# Patient Record
Sex: Male | Born: 1975 | Race: White | Hispanic: No | Marital: Married | Smoking: Never smoker
Health system: Southern US, Community
[De-identification: ages and names within clinical notes are randomized; demographics above are authoritative.]

## PROBLEM LIST (undated history)

## (undated) DIAGNOSIS — I10 Essential (primary) hypertension: Secondary | ICD-10-CM

## (undated) DIAGNOSIS — K219 Gastro-esophageal reflux disease without esophagitis: Secondary | ICD-10-CM

## (undated) HISTORY — PX: SHOULDER SURGERY: SHX246

## (undated) HISTORY — PX: BACK SURGERY: SHX140

## (undated) HISTORY — PX: TONSILLECTOMY: SUR1361

## (undated) HISTORY — PX: KNEE SURGERY: SHX244

## (undated) HISTORY — PX: FEMUR FRACTURE SURGERY: SHX633

## (undated) HISTORY — PX: NASAL SEPTUM SURGERY: SHX37

---

## 1999-05-25 ENCOUNTER — Ambulatory Visit (HOSPITAL_BASED_OUTPATIENT_CLINIC_OR_DEPARTMENT_OTHER): Admission: RE | Admit: 1999-05-25 | Discharge: 1999-05-25 | Payer: Self-pay | Admitting: Otolaryngology

## 2000-11-25 ENCOUNTER — Encounter: Admission: RE | Admit: 2000-11-25 | Discharge: 2000-12-05 | Payer: Self-pay | Admitting: Orthopaedic Surgery

## 2001-06-06 ENCOUNTER — Encounter: Payer: Self-pay | Admitting: Family Medicine

## 2001-06-06 ENCOUNTER — Ambulatory Visit (HOSPITAL_COMMUNITY): Admission: RE | Admit: 2001-06-06 | Discharge: 2001-06-06 | Payer: Self-pay | Admitting: Family Medicine

## 2001-11-18 ENCOUNTER — Ambulatory Visit (HOSPITAL_BASED_OUTPATIENT_CLINIC_OR_DEPARTMENT_OTHER): Admission: RE | Admit: 2001-11-18 | Discharge: 2001-11-18 | Payer: Self-pay | Admitting: Orthopaedic Surgery

## 2001-12-23 ENCOUNTER — Ambulatory Visit (HOSPITAL_COMMUNITY): Admission: RE | Admit: 2001-12-23 | Discharge: 2001-12-23 | Payer: Self-pay | Admitting: Orthopaedic Surgery

## 2001-12-23 ENCOUNTER — Encounter: Payer: Self-pay | Admitting: Orthopaedic Surgery

## 2003-07-16 ENCOUNTER — Ambulatory Visit (HOSPITAL_COMMUNITY): Admission: RE | Admit: 2003-07-16 | Discharge: 2003-07-16 | Payer: Self-pay | Admitting: Surgery

## 2003-07-16 ENCOUNTER — Ambulatory Visit (HOSPITAL_BASED_OUTPATIENT_CLINIC_OR_DEPARTMENT_OTHER): Admission: RE | Admit: 2003-07-16 | Discharge: 2003-07-16 | Payer: Self-pay | Admitting: Surgery

## 2004-12-29 ENCOUNTER — Ambulatory Visit (HOSPITAL_BASED_OUTPATIENT_CLINIC_OR_DEPARTMENT_OTHER): Admission: RE | Admit: 2004-12-29 | Discharge: 2004-12-29 | Payer: Self-pay | Admitting: Otolaryngology

## 2005-01-07 ENCOUNTER — Ambulatory Visit: Payer: Self-pay | Admitting: Internal Medicine

## 2011-02-22 DIAGNOSIS — S7291XA Unspecified fracture of right femur, initial encounter for closed fracture: Secondary | ICD-10-CM | POA: Insufficient documentation

## 2016-09-26 DIAGNOSIS — Z8673 Personal history of transient ischemic attack (TIA), and cerebral infarction without residual deficits: Secondary | ICD-10-CM | POA: Insufficient documentation

## 2016-09-26 DIAGNOSIS — K219 Gastro-esophageal reflux disease without esophagitis: Secondary | ICD-10-CM | POA: Insufficient documentation

## 2017-09-23 ENCOUNTER — Other Ambulatory Visit: Payer: Self-pay

## 2017-09-23 ENCOUNTER — Emergency Department (HOSPITAL_BASED_OUTPATIENT_CLINIC_OR_DEPARTMENT_OTHER)
Admission: EM | Admit: 2017-09-23 | Discharge: 2017-09-23 | Disposition: A | Discharge status: Home / Self Care | Payer: No Typology Code available for payment source | Attending: Emergency Medicine | Admitting: Emergency Medicine

## 2017-09-23 ENCOUNTER — Encounter (HOSPITAL_BASED_OUTPATIENT_CLINIC_OR_DEPARTMENT_OTHER): Payer: Self-pay | Admitting: *Deleted

## 2017-09-23 ENCOUNTER — Emergency Department (HOSPITAL_BASED_OUTPATIENT_CLINIC_OR_DEPARTMENT_OTHER): Payer: No Typology Code available for payment source

## 2017-09-23 DIAGNOSIS — Z79899 Other long term (current) drug therapy: Secondary | ICD-10-CM | POA: Diagnosis not present

## 2017-09-23 DIAGNOSIS — I10 Essential (primary) hypertension: Secondary | ICD-10-CM | POA: Diagnosis not present

## 2017-09-23 DIAGNOSIS — Y92410 Unspecified street and highway as the place of occurrence of the external cause: Secondary | ICD-10-CM | POA: Insufficient documentation

## 2017-09-23 DIAGNOSIS — Y939 Activity, unspecified: Secondary | ICD-10-CM | POA: Insufficient documentation

## 2017-09-23 DIAGNOSIS — M79645 Pain in left finger(s): Secondary | ICD-10-CM | POA: Diagnosis not present

## 2017-09-23 DIAGNOSIS — M546 Pain in thoracic spine: Secondary | ICD-10-CM

## 2017-09-23 DIAGNOSIS — Z7982 Long term (current) use of aspirin: Secondary | ICD-10-CM | POA: Insufficient documentation

## 2017-09-23 DIAGNOSIS — Y999 Unspecified external cause status: Secondary | ICD-10-CM | POA: Diagnosis not present

## 2017-09-23 DIAGNOSIS — M25551 Pain in right hip: Secondary | ICD-10-CM | POA: Diagnosis not present

## 2017-09-23 HISTORY — DX: Essential (primary) hypertension: I10

## 2017-09-23 HISTORY — DX: Gastro-esophageal reflux disease without esophagitis: K21.9

## 2017-09-23 MED ORDER — IBUPROFEN 800 MG PO TABS
800.0000 mg | ORAL_TABLET | Freq: Once | ORAL | Status: AC
  Administered 2017-09-23: 800 mg via ORAL
  Filled 2017-09-23: qty 1

## 2017-09-23 MED ORDER — ACETAMINOPHEN 500 MG PO TABS
1000.0000 mg | ORAL_TABLET | Freq: Once | ORAL | Status: AC
  Administered 2017-09-23: 1000 mg via ORAL
  Filled 2017-09-23: qty 2

## 2017-09-23 MED ORDER — OXYCODONE HCL 5 MG PO TABS
5.0000 mg | ORAL_TABLET | Freq: Once | ORAL | Status: AC
  Administered 2017-09-23: 5 mg via ORAL
  Filled 2017-09-23: qty 1

## 2017-09-23 NOTE — Discharge Instructions (Signed)
Take 4 over the counter ibuprofen tablets 3 times a day or 2 over-the-counter naproxen tablets twice a day for pain. Also take tylenol 1000mg(2 extra strength) four times a day.    
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
## 2019-08-13 DIAGNOSIS — J1282 Pneumonia due to coronavirus disease 2019: Secondary | ICD-10-CM

## 2019-08-13 DIAGNOSIS — U071 COVID-19: Secondary | ICD-10-CM

## 2019-08-13 HISTORY — DX: COVID-19: U07.1

## 2019-08-13 HISTORY — DX: Pneumonia due to coronavirus disease 2019: J12.82

## 2019-09-10 DIAGNOSIS — U071 COVID-19: Secondary | ICD-10-CM | POA: Insufficient documentation

## 2019-09-10 DIAGNOSIS — J9600 Acute respiratory failure, unspecified whether with hypoxia or hypercapnia: Secondary | ICD-10-CM | POA: Insufficient documentation

## 2020-07-11 ENCOUNTER — Emergency Department (INDEPENDENT_AMBULATORY_CARE_PROVIDER_SITE_OTHER)
Admission: RE | Admit: 2020-07-11 | Discharge: 2020-07-11 | Disposition: A | Discharge status: Home / Self Care | Payer: BC Managed Care – PPO | Source: Ambulatory Visit | Attending: Family Medicine | Admitting: Family Medicine

## 2020-07-11 ENCOUNTER — Emergency Department (INDEPENDENT_AMBULATORY_CARE_PROVIDER_SITE_OTHER): Payer: BC Managed Care – PPO

## 2020-07-11 ENCOUNTER — Other Ambulatory Visit: Payer: Self-pay

## 2020-07-11 VITALS — BP 143/99 | HR 77 | Temp 98.8°F | Resp 16 | Ht 72.0 in | Wt 250.0 lb

## 2020-07-11 DIAGNOSIS — J069 Acute upper respiratory infection, unspecified: Secondary | ICD-10-CM | POA: Diagnosis not present

## 2020-07-11 DIAGNOSIS — Z8709 Personal history of other diseases of the respiratory system: Secondary | ICD-10-CM | POA: Diagnosis not present

## 2020-07-11 DIAGNOSIS — Z8616 Personal history of COVID-19: Secondary | ICD-10-CM | POA: Diagnosis not present

## 2020-07-11 NOTE — ED Provider Notes (Signed)
[facility]    CSN: [id number] Arrival date & time: 07/11/20  0843      History   Chief Complaint Chief Complaint  Patient presents with  . Shortness of Breath  . 9am appointment     HPI Troy Ferguson is a 45 y.o. male.   Patient complains of sinus congestion, slightly scratchy throat, and tightness in his chest for two days.  He denies cough and fever.  He feels well otherwise. He was hospitalized for 3 weeks with COVID pneumonia last year. Review of chart records:  Followup chest x-ray [facility]) 11/24/19 revealed persistent but improved opacities.  The history is provided by the patient.    Past Medical History:  Diagnosis Date  . GERD (gastroesophageal reflux disease)   . Hypertension   . Pneumonia due to COVID-19 virus 08/2019    Patient Active Problem List   Diagnosis Date Noted  . COVID-19 09/10/2019  . Acute respiratory failure due to COVID-19 (HCC) 09/10/2019  . GERD without esophagitis 09/26/2016  . History of TIA (transient ischemic attack) 09/26/2016  . Closed fracture of right femur (HCC) 02/22/2011    Past Surgical History:  Procedure Laterality Date  . BACK SURGERY    . FEMUR FRACTURE SURGERY    . KNEE SURGERY    . NASAL SEPTUM SURGERY    . SHOULDER SURGERY    . TONSILLECTOMY         Home Medications    Prior to Admission medications   Medication Sig Start Date End Date Taking? Authorizing Provider  albuterol (PROVENTIL) (2.5 MG/3ML) 0.083% nebulizer solution Take 3 mLs (2.5 mg dose) by nebulization every 6 (six) hours. 10/02/19  Yes [provider]  albuterol (VENTOLIN HFA) 108 (90 Base) MCG/ACT inhaler INH 2 puffs q6 scheduled and q2 prn increased SOB, cough, wheeze.   Administer using spacer device 10/02/19  Yes [provider]  aspirin EC 81 MG tablet Take 81 mg by mouth daily.   Yes [provider]  fluticasone (FLONASE) 50 MCG/ACT nasal spray 2 sprays by Each Nare route daily.  06/26/19  Yes [provider]  Multiple Vitamin (MULTI-VITAMIN) tablet Take by mouth.   Yes [provider]  Olmesartan-amLODIPine-HCTZ 20-5-12.5 MG TABS Take 1 tablet by mouth daily. 06/20/20  Yes [provider]  omeprazole (PRILOSEC) 20 MG capsule Take 1 capsule by mouth daily. 02/25/20  Yes [provider]  LISINOPRIL PO Take by mouth.    [provider]  OMEPRAZOLE PO Take by mouth. Patient not taking: Reported on 07/11/2020    [provider]  TRAZODONE HCL PO Take by mouth. Patient not taking: Reported on 07/11/2020    [provider]    Family History Family History  Problem Relation Age of Onset  . Hypertension Mother   . Healthy Father     Social History Social History   Tobacco Use  . Smoking status: Never Smoker  . Smokeless tobacco: Never Used  Substance Use Topics  . Alcohol use: Not Currently  . Drug use: Never     Allergies   Morphine and related and Prednisone   Review of Systems Review of Systems  Minimal sore throat No cough No pleuritic pain but feels tight in chest No wheezing + nasal congestion + post-nasal drainage No sinus pain/pressure No itchy/red eyes No earache No hemoptysis + SOB No fever/chills No nausea No vomiting No abdominal pain No diarrhea No urinary symptoms No skin rash No fatigue  No myalgias No headache Used OTC meds (Claritin) without relief    Physical Exam Triage Vital Signs ED Triage Vitals  Enc Vitals Group     BP 07/11/20 0904 (!) 143/99     Pulse Rate 07/11/20 0904 77     Resp 07/11/20 0904 16     Temp 07/11/20 0904 98.8 F (37.1 C)     Temp Source 07/11/20 0904 Oral     SpO2 07/11/20 0904 98 %     Weight 07/11/20 0906 250 lb (113.4 kg)     Height 07/11/20 0906 6' (1.829 m)     Head Circumference --      Peak Flow --      Pain Score 07/11/20 0905 0     Pain Loc --      Pain Edu? --      Excl. in GC? --    No data found.  Updated Vital  Signs BP (!) 143/99 (BP Location: Right Arm) Comment: HTN- has not taken meds today  Pulse 77   Temp 98.8 F (37.1 C) (Oral)   Resp 16   Ht 6' (1.829 m)   Wt 113.4 kg   SpO2 98%   BMI 33.91 kg/m   Visual Acuity Right Eye Distance:   Left Eye Distance:   Bilateral Distance:    Right Eye Near:   Left Eye Near:    Bilateral Near:     Physical Exam Nursing notes and Vital Signs reviewed. Appearance:  Patient appears stated age, and in no acute distress Eyes:  Pupils are equal, round, and reactive to light and accomodation.  Extraocular movement is intact.  Conjunctivae are not inflamed  Ears:  Canals normal.  Tympanic membranes normal.  Nose:  Mildly congested turbinates.  No sinus tenderness.  Pharynx:  Normal Neck:  Supple.  Mildly enlarged lateral nodes are present, tender to palpation on the left.   Lungs:  Clear to auscultation.  Breath sounds are equal.  Moving air well. Heart:  Regular rate and rhythm without murmurs, rubs, or gallops.  Abdomen:  Nontender without masses or hepatosplenomegaly.  Bowel sounds are present.  No CVA or flank tenderness.  Extremities:  No edema.  Skin:  No rash present.   UC Treatments / Results  Labs (all labs ordered are listed, but only abnormal results are displayed) Labs Reviewed  NOVEL CORONAVIRUS, NAA    EKG   Radiology DG Chest 2 View  Result Date: 07/11/2020 CLINICAL DATA:  Here today for SOB w/ nasal congestionHospitalized last year w/ COVID pneumonia x 3 weeks in July 2020Concerned about pneumoniaCongestion x 3 days Denies fever or chills EXAM: CHEST - 2 VIEW COMPARISON:  11/24/2019. FINDINGS: Cardiac silhouette is normal in size and configuration. Normal mediastinal and hilar contours. Clear lungs.  No pleural effusion or pneumothorax. Stable changes from a prior lower thoracic spine/upper lumbar spine fusion. No acute skeletal abnormality. IMPRESSION: No active cardiopulmonary disease. Electronically Signed   By: Amie Portland M.D.   On: 07/11/2020 10:06    Procedures Procedures (including critical care time)  Medications Ordered in UC Medications - No data to display  Initial Impression / Assessment and Plan / UC Course  I have reviewed the triage vital signs and the nursing notes.  Pertinent labs & imaging results that were available during my care of the patient were reviewed by me and considered in my medical decision making (see chart for details).    Benign exam.  There is no evidence  of bacterial infection today.  Treat symptomatically for now  COVID19 PCR pending. Followup with Family Doctor if not improved in about 10 days.   Final Clinical Impressions(s) / UC Diagnoses   Final diagnoses:  Viral URI     Discharge Instructions     Take plain guaifenesin (1200mg  extended release tabs such as Mucinex) twice daily, with plenty of water, for cough and congestion. Get adequate rest.   May use Afrin nasal spray (or generic oxymetazoline) each morning for about 5 days and then discontinue.  Also recommend using saline nasal spray several times daily and saline nasal irrigation (AYR is a common brand).  Use Flonase nasal spray each morning after using Afrin nasal spray and saline nasal irrigation. Try warm salt water gargles for sore throat.  Stop all antihistamines (Claritin, etc) for now, and other non-prescription cough/cold preparations. May take Delsym Cough Suppressant ("12 Hour Cough Relief") at bedtime for nighttime cough.  May take Tylenol or ibuprofen for fever, headache, etc.   If your COVID-19 test is positive, isolate yourself for five days from the time of your symptom onset.  At the end of five days you may end isolation if your symptoms have cleared or improved, and you have not had a fever for 24 hours. At this time you should wear a mask for five more days when you are around others.   If symptoms become significantly worse during the night or over the weekend, proceed to the  local emergency room.         ED Prescriptions    None        , MD 07/12/20 07/14/20

## 2020-07-11 NOTE — ED Triage Notes (Signed)
Here today for SOB w/ nasal congestion Hospitalized last year w/ COVID pneumonia x 3 weeks in July 2020 Concerned about pneumonia Congestion x 3 days  Denies fever or chills No OTC meds No COVID vaccine - tests antibodies every few months

## 2020-07-11 NOTE — Discharge Instructions (Addendum)
Take plain guaifenesin (1200mg  extended release tabs such as Mucinex) twice daily, with plenty of water, for cough and congestion. Get adequate rest.   May use Afrin nasal spray (or generic oxymetazoline) each morning for about 5 days and then discontinue.  Also recommend using saline nasal spray several times daily and saline nasal irrigation (AYR is a common brand).  Use Flonase nasal spray each morning after using Afrin nasal spray and saline nasal irrigation. Try warm salt water gargles for sore throat.  Stop all antihistamines (Claritin, etc) for now, and other non-prescription cough/cold preparations. May take Delsym Cough Suppressant ("12 Hour Cough Relief") at bedtime for nighttime cough.  May take Tylenol or ibuprofen for fever, headache, etc.   If your COVID-19 test is positive, isolate yourself for five days from the time of your symptom onset.  At the end of five days you may end isolation if your symptoms have cleared or improved, and you have not had a fever for 24 hours. At this time you should wear a mask for five more days when you are around others.   If symptoms become significantly worse during the night or over the weekend, proceed to the local emergency room.

## 2020-07-12 LAB — SARS-COV-2, NAA 2 DAY TAT

## 2020-07-12 LAB — NOVEL CORONAVIRUS, NAA: SARS-CoV-2, NAA: NOT DETECTED

## 2020-08-05 ENCOUNTER — Emergency Department (INDEPENDENT_AMBULATORY_CARE_PROVIDER_SITE_OTHER)
Admission: RE | Admit: 2020-08-05 | Discharge: 2020-08-05 | Disposition: A | Discharge status: Home / Self Care | Payer: BC Managed Care – PPO | Source: Ambulatory Visit

## 2020-08-05 ENCOUNTER — Ambulatory Visit: Payer: BC Managed Care – PPO

## 2020-08-05 ENCOUNTER — Other Ambulatory Visit: Payer: Self-pay

## 2020-08-05 VITALS — BP 138/95 | HR 91 | Temp 98.5°F | Resp 17

## 2020-08-05 DIAGNOSIS — B9689 Other specified bacterial agents as the cause of diseases classified elsewhere: Secondary | ICD-10-CM

## 2020-08-05 DIAGNOSIS — J019 Acute sinusitis, unspecified: Secondary | ICD-10-CM | POA: Diagnosis not present

## 2020-08-05 DIAGNOSIS — J309 Allergic rhinitis, unspecified: Secondary | ICD-10-CM | POA: Diagnosis not present

## 2020-08-05 MED ORDER — AMOXICILLIN-POT CLAVULANATE 875-125 MG PO TABS: 1.0000 | ORAL_TABLET | Freq: Two times a day (BID) | ORAL | 0 refills | Status: DC

## 2020-08-05 MED ORDER — FEXOFENADINE HCL 180 MG PO TABS: 180.0000 mg | ORAL_TABLET | Freq: Every day | ORAL | 0 refills | Status: AC

## 2020-08-05 NOTE — Discharge Instructions (Addendum)
Advised/instructed patient to take medication as directed with food to completion.  Advised patient to hold Claritin for the next 7 to 10 days and use Allegra 180 mg daily for the next 5 days, then as needed.  Encourage patient increase daily water intake while taking these medications.

## 2020-08-05 NOTE — ED Triage Notes (Signed)
Pt c/o cough (mostly dry) and nasal congestion since Tuesday. Fever of 100 yesterday. Resolved on its own. Mucinex prn. Hx of seasonal allergies, Claritin daily.

## 2020-08-05 NOTE — ED Provider Notes (Signed)
[facility]    CSN: [id number] Arrival date & time: 08/05/20  1051      History   Chief Complaint Chief Complaint  Patient presents with   Cough    APPT 11am   Nasal Congestion    HPI Troy Ferguson is a 45 y.o. male.   HPI 45 year old male presents with cough and nasal congestion for 4 days.  Reports oral temp of 100.0 yesterday is resolved on it's own, orts using Mucinex as needed and has history of seasonal allergies taking Claritin daily.  Patient was evaluated here on 07/11/2020 and found to have viral URI.  Past Medical History:  Diagnosis Date   GERD (gastroesophageal reflux disease)    Hypertension    Pneumonia due to COVID-19 virus 08/2019    Patient Active Problem List   Diagnosis Date Noted   COVID-19 09/10/2019   Acute respiratory failure due to COVID-19 [facility]) 09/10/2019   GERD without esophagitis 09/26/2016   History of TIA (transient ischemic attack) 09/26/2016   Closed fracture of right femur (HCC) 02/22/2011    Past Surgical History:  Procedure Laterality Date   BACK SURGERY     FEMUR FRACTURE SURGERY     KNEE SURGERY     NASAL SEPTUM SURGERY     SHOULDER SURGERY     TONSILLECTOMY         Home Medications    Prior to Admission medications   Medication Sig Start Date End Date Taking? Authorizing Provider  amoxicillin-clavulanate (AUGMENTIN) 875-125 MG tablet Take 1 tablet by mouth every 12 (twelve) hours. 08/05/20  Yes Trevor Iha, FNP  fexofenadine [facility] ALLERGY) 180 MG tablet Take 1 tablet (180 mg total) by mouth daily for 15 days. 08/05/20 08/20/20 Yes Trevor Iha, FNP  albuterol (PROVENTIL) (2.5 MG/3ML) 0.083% nebulizer solution Take 3 mLs (2.5 mg dose) by nebulization every 6 (six) hours. 10/02/19   [provider]  albuterol (VENTOLIN HFA) 108 (90 Base) MCG/ACT inhaler INH 2 puffs q6 scheduled and q2 prn increased SOB, cough, wheeze.   Administer using spacer device 10/02/19   [provider]  aspirin EC  81 MG tablet Take 81 mg by mouth daily.    [provider]  fluticasone (FLONASE) 50 MCG/ACT nasal spray 2 sprays by Each Nare route daily. 06/26/19   [provider]  LISINOPRIL PO Take by mouth.    [provider]  Multiple Vitamin (MULTI-VITAMIN) tablet Take by mouth.    [provider]  Olmesartan-amLODIPine-HCTZ 20-5-12.5 MG TABS Take 1 tablet by mouth daily. 06/20/20   [provider]  omeprazole (PRILOSEC) 20 MG capsule Take 1 capsule by mouth daily. 02/25/20   [provider]  OMEPRAZOLE PO Take by mouth. Patient not taking: Reported on 07/11/2020    [provider]  TRAZODONE HCL PO Take by mouth. Patient not taking: Reported on 07/11/2020    [provider]    Family History Family History  Problem Relation Age of Onset   Hypertension Mother    Healthy Father     Social History Social History   Tobacco Use   Smoking status: Never   Smokeless tobacco: Never  Substance Use Topics   Alcohol use: Not Currently   Drug use: Never     Allergies   Morphine and related and Prednisone   Review of Systems Review of Systems  Constitutional: Negative.   HENT:  Positive for congestion.   Eyes: Negative.   Respiratory:  Positive for cough.  Cardiovascular: Negative.   Gastrointestinal: Negative.   Genitourinary: Negative.   Musculoskeletal: Negative.   Skin: Negative.   Neurological: Negative.     Physical Exam Triage Vital Signs ED Triage Vitals  Enc Vitals Group     BP 08/05/20 1101 (!) 138/95     Pulse Rate 08/05/20 1101 91     Resp 08/05/20 1101 17     Temp 08/05/20 1101 98.5 F (36.9 C)     Temp Source 08/05/20 1101 Oral     SpO2 08/05/20 1101 97 %     Weight --      Height --      Head Circumference --      Peak Flow --      Pain Score 08/05/20 1102 0     Pain Loc --      Pain Edu? --      Excl. in [place]? --    No data found.  Updated Vital Signs BP (!) 138/95 (BP Location: Right  Arm)   Pulse 91   Temp 98.5 F (36.9 C) (Oral)   Resp 17   SpO2 97%     Physical Exam Vitals and nursing note reviewed.  Constitutional:      General: He is not in acute distress.    Appearance: Normal appearance. He is obese. He is not ill-appearing.  HENT:     Head: Normocephalic and atraumatic.     Right Ear: Tympanic membrane and ear canal normal.     Left Ear: Tympanic membrane and ear canal normal.     Nose: Congestion present. No rhinorrhea.     Comments: Greenish/mucopurulent rhinorrhea noted    Mouth/Throat:     Mouth: Mucous membranes are moist.     Pharynx: Oropharynx is clear. No oropharyngeal exudate or posterior oropharyngeal erythema.     Comments: Moderate amount of clear drainage of posterior oropharynx noted Eyes:     Extraocular Movements: Extraocular movements intact.     Conjunctiva/sclera: Conjunctivae normal.     Pupils: Pupils are equal, round, and reactive to light.  Cardiovascular:     Rate and Rhythm: Normal rate and regular rhythm.     Pulses: Normal pulses.     Heart sounds: Normal heart sounds.  Pulmonary:     Effort: Pulmonary effort is normal. No respiratory distress.     Breath sounds: Normal breath sounds. No wheezing, rhonchi or rales.  Musculoskeletal:        General: Normal range of motion.     Cervical back: Normal range of motion and neck supple. No tenderness.  Lymphadenopathy:     Cervical: No cervical adenopathy.  Skin:    General: Skin is warm and dry.  Neurological:     General: No focal deficit present.     Mental Status: He is alert and oriented to person, place, and time.  Psychiatric:        Mood and Affect: Mood normal.        Behavior: Behavior normal.     UC Treatments / Results  Labs (all labs ordered are listed, but only abnormal results are displayed) Labs Reviewed - No data to display  EKG   Radiology No results found.  Procedures Procedures (including critical care time)  Medications Ordered in  UC Medications - No data to display  Initial Impression / Assessment and Plan / UC Course  I have reviewed the triage vital signs and the nursing notes.  Pertinent labs & imaging results that were available  during my care of the patient were reviewed by me and considered in my medical decision making (see chart for details).    MDM: 1.  Acute bacterial rhinosinusitis, 2.  Allergic rhinitis.  Patient discharged home, hemodynamically stable. Final Clinical Impressions(s) / UC Diagnoses   Final diagnoses:  Acute bacterial rhinosinusitis  Allergic rhinitis, unspecified seasonality, unspecified trigger     Discharge Instructions      Advised/instructed patient to take medication as directed with food to completion.  Advised patient to hold Claritin for the next 7 to 10 days and use Allegra 180 mg daily for the next 5 days, then as needed.  Encourage patient increase daily water intake while taking these medications.     ED Prescriptions     Medication Sig Dispense Auth. Provider   amoxicillin-clavulanate (AUGMENTIN) 875-125 MG tablet Take 1 tablet by mouth every 12 (twelve) hours. 14 tablet Trevor Iha, FNP   fexofenadine [facility] ALLERGY) 180 MG tablet Take 1 tablet (180 mg total) by mouth daily for 15 days. 15 tablet Trevor Iha, FNP      PDMP not reviewed this encounter.   Trevor Iha, FNP 08/05/20 1151

## 2021-03-28 ENCOUNTER — Emergency Department (INDEPENDENT_AMBULATORY_CARE_PROVIDER_SITE_OTHER)
Admission: EM | Admit: 2021-03-28 | Discharge: 2021-03-28 | Disposition: A | Discharge status: Home / Self Care | Payer: BC Managed Care – PPO | Source: Home / Self Care | Attending: Family Medicine | Admitting: Family Medicine

## 2021-03-28 ENCOUNTER — Other Ambulatory Visit: Payer: Self-pay

## 2021-03-28 ENCOUNTER — Emergency Department: Admit: 2021-03-28 | Discharge status: Absent without leave | Payer: Self-pay

## 2021-03-28 DIAGNOSIS — J069 Acute upper respiratory infection, unspecified: Secondary | ICD-10-CM | POA: Diagnosis not present

## 2021-03-28 LAB — POC SARS CORONAVIRUS 2 AG -  ED: SARS Coronavirus 2 Ag: NEGATIVE

## 2021-03-28 MED ORDER — PREDNISONE 20 MG PO TABS: 20.0000 mg | ORAL_TABLET | Freq: Two times a day (BID) | ORAL | 0 refills | Status: AC

## 2021-03-28 NOTE — Discharge Instructions (Signed)
The COVID test is negative Sometimes a COVID test early on is flawed, if you continue sick I would repeat the COVID test in 48 hours For now I am going to give you prednisone to help with the shortness of breath and symptoms Rest and drink lots of fluids Follow-up with your usual doctor

## 2021-03-28 NOTE — ED Provider Notes (Signed)
[facility]    CSN: [id number] Arrival date & time: 03/28/21  1917      History   Chief Complaint Chief Complaint  Patient presents with   Cough   Sore Throat   Shortness of Breath    HPI ZAELYN BARBARY is a 46 y.o. male.   HPI  Patient was desperately ill with COVID in 2021, was hospitalized for 3 weeks and was on oxygen for an additional 3 months.  He was left with shortness of breath with exertion and his lungs never returned to normal.  He is back at work.  He currently has cough and shortness of breath since yesterday.  Body aches and some headache.  No sweats chills or fever.  Past Medical History:  Diagnosis Date   GERD (gastroesophageal reflux disease)    Hypertension    Pneumonia due to COVID-19 virus 08/2019    Patient Active Problem List   Diagnosis Date Noted   COVID-19 09/10/2019   Acute respiratory failure due to COVID-19 [facility]) 09/10/2019   GERD without esophagitis 09/26/2016   History of TIA (transient ischemic attack) 09/26/2016   Closed fracture of right femur (HCC) 02/22/2011    Past Surgical History:  Procedure Laterality Date   BACK SURGERY     FEMUR FRACTURE SURGERY     KNEE SURGERY     NASAL SEPTUM SURGERY     SHOULDER SURGERY     TONSILLECTOMY         Home Medications    Prior to Admission medications   Medication Sig Start Date End Date Taking? Authorizing Provider  albuterol (PROVENTIL) (2.5 MG/3ML) 0.083% nebulizer solution Take 3 mLs (2.5 mg dose) by nebulization every 6 (six) hours. 10/02/19   [provider]  albuterol (VENTOLIN HFA) 108 (90 Base) MCG/ACT inhaler INH 2 puffs q6 scheduled and q2 prn increased SOB, cough, wheeze.   Administer using spacer device 10/02/19   [provider]  aspirin EC 81 MG tablet Take 81 mg by mouth daily.    [provider]  fexofenadine (ALLEGRA ALLERGY) 180 MG tablet Take 1 tablet (180 mg total) by mouth daily for 15 days. 08/05/20 08/20/20  Trevor Iha, FNP   fluticasone (FLONASE) 50 MCG/ACT nasal spray 2 sprays by Each Nare route daily. 06/26/19   [provider]  LISINOPRIL PO Take by mouth.    [provider]  Multiple Vitamin (MULTI-VITAMIN) tablet Take by mouth.    [provider]  Olmesartan-amLODIPine-HCTZ 20-5-12.5 MG TABS Take 1 tablet by mouth daily. 06/20/20   [provider]  omeprazole (PRILOSEC) 20 MG capsule Take 1 capsule by mouth daily. 02/25/20   [provider]    Family History Family History  Problem Relation Age of Onset   Hypertension Mother    Healthy Father     Social History Social History   Tobacco Use   Smoking status: Never   Smokeless tobacco: Never  Substance Use Topics   Alcohol use: Not Currently   Drug use: Never     Allergies   Morphine and related and Prednisone   Review of Systems Review of Systems See HPI  Physical Exam Triage Vital Signs ED Triage Vitals  Enc Vitals Group     BP 03/28/21 1923 132/90     Pulse Rate 03/28/21 1923 82     Resp 03/28/21 1923 14     Temp 03/28/21 1923 97.9 F (36.6 C)     Temp Source 03/28/21 1923 Oral  SpO2 03/28/21 1923 96 %     Weight 03/28/21 1925 250 lb (113.4 kg)     Height --      Head Circumference --      Peak Flow --      Pain Score 03/28/21 1925 0     Pain Loc --      Pain Edu? --      Excl. in GC? --    No data found.  Updated Vital Signs BP 132/90 (BP Location: Right Arm)    Pulse 82    Temp 97.9 F (36.6 C) (Oral)    Resp 14    Wt 113.4 kg    SpO2 96%    BMI 33.91 kg/m      Physical Exam Constitutional:      General: He is not in acute distress.    Appearance: He is well-developed.     Comments: Pleasant.  Overweight  HENT:     Head: Normocephalic and atraumatic.     Nose:     Comments: Mask is in place Eyes:     Conjunctiva/sclera: Conjunctivae normal.     Pupils: Pupils are equal, round, and reactive to light.  Cardiovascular:     Rate and Rhythm: Normal rate and regular  rhythm.     Heart sounds: Normal heart sounds.  Pulmonary:     Effort: Pulmonary effort is normal. No respiratory distress.     Breath sounds: Normal breath sounds.  Abdominal:     General: There is no distension.     Palpations: Abdomen is soft.  Musculoskeletal:        General: Normal range of motion.     Cervical back: Normal range of motion.  Skin:    General: Skin is warm and dry.  Neurological:     Mental Status: He is alert.  Psychiatric:        Mood and Affect: Mood normal.        Behavior: Behavior normal.     UC Treatments / Results  Labs (all labs ordered are listed, but only abnormal results are displayed) Labs Reviewed  POC SARS CORONAVIRUS 2 AG -  ED    EKG   Radiology No results found.  Procedures Procedures (including critical care time)  Medications Ordered in UC Medications - No data to display  Initial Impression / Assessment and Plan / UC Course  I have reviewed the triage vital signs and the nursing notes.  Pertinent labs & imaging results that were available during my care of the patient were reviewed by me and considered in my medical decision making (see chart for details).     Lungs are clear I discussed my opinion that he should have COVID vaccinations and booster  He currently has a viral upper respiratory infection and can take over-the-counter medications we will give him prednisone for his sensation of shortness of breath Final Clinical Impressions(s) / UC Diagnoses   Final diagnoses:  Viral upper respiratory tract infection     Discharge Instructions      The COVID test is negative Sometimes a COVID test early on is flawed, if you continue sick I would repeat the COVID test in 48 hours For now I am going to give you prednisone to help with the shortness of breath and symptoms Rest and drink lots of fluids Follow-up with your usual doctor   ED Prescriptions   None    PDMP not reviewed this encounter.   Eustace Moore,  MD 03/28/21 1954

## 2021-03-28 NOTE — ED Triage Notes (Signed)
Pt presents with cough, sore throat, and SOB that began yesterday.  Pt had hospitalization for three weeks with Covid in 2021. Pt unvaccinated

## 2021-05-02 ENCOUNTER — Emergency Department (INDEPENDENT_AMBULATORY_CARE_PROVIDER_SITE_OTHER)
Admission: RE | Admit: 2021-05-02 | Discharge: 2021-05-02 | Disposition: A | Discharge status: Home / Self Care | Payer: BC Managed Care – PPO | Source: Ambulatory Visit

## 2021-05-02 ENCOUNTER — Emergency Department (INDEPENDENT_AMBULATORY_CARE_PROVIDER_SITE_OTHER): Payer: BC Managed Care – PPO

## 2021-05-02 ENCOUNTER — Other Ambulatory Visit: Payer: Self-pay

## 2021-05-02 VITALS — BP 121/80 | HR 71 | Temp 98.6°F | Resp 18 | Ht 72.0 in | Wt 250.0 lb

## 2021-05-02 DIAGNOSIS — S92514B Nondisplaced fracture of proximal phalanx of right lesser toe(s), initial encounter for open fracture: Secondary | ICD-10-CM

## 2021-05-02 DIAGNOSIS — S99821A Other specified injuries of right foot, initial encounter: Secondary | ICD-10-CM | POA: Diagnosis not present

## 2021-05-02 NOTE — Discharge Instructions (Addendum)
Advised/informed patient of right foot x-ray results this evening-nondisplaced acute fracture of the proximal phalanx in the right fifth toe with surrounding soft tissue swelling.  Hard copy of this study provided to patient with AVS.  Advised patient to wear right postop shoe 24/7 (except when sleeping and bathing) until evaluation by orthopedic provider.  [facility]opedic provider contact information provided with this AVS.  Advised patient to call this provider first thing tomorrow morning, Wednesday, 05/03/2021 to schedule appointment for further evaluation.  Advised patient may take OTC Ibuprofen 800 mg 1-2 times daily, as needed. Advised patient if symptoms worsen and/or unresolved please follow-up with PCP, orthopedics or here for further evaluation ?

## 2021-05-02 NOTE — ED Triage Notes (Signed)
Pt presents to Urgent Care with c/o R 5th toe pain after injury last night. State he accidentally kicked a suitcase and has had swelling and pain to the toe since.  ?

## 2021-05-02 NOTE — ED Provider Notes (Signed)
?[place] ? ? ? ?CSN: [id number] ?Arrival date & time: 05/02/21  1644 ? ? ?  ? ?History   ?Chief Complaint ?Chief Complaint  ?Patient presents with  ? Toe Injury  ?  Possibly may been broken. - Entered by patient  ? ? ?HPI ?Troy Ferguson is a 46 y.o. male.  ? ?HPI 46 year old male presents with right fifth toe pain after injury last night.  Reports he accidentally kicked a suitcase and has had swelling and pain to the toe since.  PMH significant for HTN and GERD. ? ?Past Medical History:  ?Diagnosis Date  ? GERD (gastroesophageal reflux disease)   ? Hypertension   ? Pneumonia due to COVID-19 virus 08/2019  ? ? ?Patient Active Problem List  ? Diagnosis Date Noted  ? COVID-19 09/10/2019  ? Acute respiratory failure due to COVID-19 [facility]) 09/10/2019  ? GERD without esophagitis 09/26/2016  ? History of TIA (transient ischemic attack) 09/26/2016  ? Closed fracture of right femur ([place]) 02/22/2011  ? ? ?Past Surgical History:  ?Procedure Laterality Date  ? BACK SURGERY    ? FEMUR FRACTURE SURGERY    ? KNEE SURGERY    ? NASAL SEPTUM SURGERY    ? SHOULDER SURGERY    ? TONSILLECTOMY    ? ? ? ? ? ?Home Medications   ? ?Prior to Admission medications   ?Medication Sig Start Date End Date Taking? Authorizing Provider  ?ibuprofen (ADVIL) 400 MG tablet Take 400 mg by mouth every 6 (six) hours as needed.   Yes [provider]  ?albuterol (PROVENTIL) (2.5 MG/3ML) 0.083% nebulizer solution Take 3 mLs (2.5 mg dose) by nebulization every 6 (six) hours. 10/02/19   [provider]  ?albuterol (VENTOLIN HFA) 108 (90 Base) MCG/ACT inhaler INH 2 puffs q6 scheduled and q2 prn increased SOB, cough, wheeze.   Administer using spacer device 10/02/19   [provider]  ?aspirin EC 81 MG tablet Take 81 mg by mouth daily.    [provider]  ?fexofenadine (ALLEGRA ALLERGY) 180 MG tablet Take 1 tablet (180 mg total) by mouth daily for 15 days. 08/05/20 08/20/20  Eliezer Lofts, FNP  ?fluticasone (FLONASE)  50 MCG/ACT nasal spray 2 sprays by Each Nare route daily. 06/26/19   [provider]  ?LISINOPRIL PO Take by mouth.    [provider]  ?Multiple Vitamin (MULTI-VITAMIN) tablet Take by mouth.    [provider]  ?Olmesartan-amLODIPine-HCTZ 20-5-12.5 MG TABS Take 1 tablet by mouth daily. 06/20/20   [provider]  ?omeprazole (PRILOSEC) 20 MG capsule Take 1 capsule by mouth daily. 02/25/20   [provider]  ?predniSONE (DELTASONE) 20 MG tablet Take 1 tablet (20 mg total) by mouth 2 (two) times daily with a meal. 03/28/21   Raylene Everts, MD  ? ? ?Family History ?Family History  ?Problem Relation Age of Onset  ? Hypertension Mother   ? Healthy Father   ? ? ?Social History ?Social History  ? ?Tobacco Use  ? Smoking status: Never  ? Smokeless tobacco: Never  ?Vaping Use  ? Vaping Use: Never used  ?Substance Use Topics  ? Alcohol use: Not Currently  ? Drug use: Never  ? ? ? ?Allergies   ?Morphine and related and Prednisone ? ? ?Review of Systems ?Review of Systems  ?Musculoskeletal:   ?     Right fifth toe pain x1 day  ? ? ?Physical Exam ?Triage Vital Signs ?ED Triage Vitals  ?Enc Vitals Group  ?  BP 05/02/21 1747 121/80  ?   Pulse Rate 05/02/21 1747 71  ?   Resp 05/02/21 1747 18  ?   Temp 05/02/21 1747 98.6 ?F (37 ?C)  ?   Temp src --   ?   SpO2 05/02/21 1747 94 %  ?   Weight 05/02/21 1743 250 lb (113.4 kg)  ?   Height 05/02/21 1743 6' (1.829 m)  ?   Head Circumference --   ?   Peak Flow --   ?   Pain Score 05/02/21 1743 8  ?   Pain Loc --   ?   Pain Edu? --   ?   Excl. in [place]? --   ? ?No data found. ? ?Updated Vital Signs ?BP 121/80 (BP Location: Right Arm)   Pulse 71   Temp 98.6 ?F (37 ?C)   Resp 18   Ht 6' (1.829 m)   Wt 250 lb (113.4 kg)   SpO2 94%   BMI 33.91 kg/m?  ? ?Physical Exam ?Vitals reviewed.  ?Constitutional:   ?   General: He is not in acute distress. ?   Appearance: Normal appearance. He is obese. He is not ill-appearing.  ?HENT:  ?   Mouth/Throat:  ?    Mouth: Mucous membranes are moist.  ?   Pharynx: Oropharynx is clear.  ?Eyes:  ?   Extraocular Movements: Extraocular movements intact.  ?   Conjunctiva/sclera: Conjunctivae normal.  ?   Pupils: Pupils are equal, round, and reactive to light.  ?Cardiovascular:  ?   Rate and Rhythm: Normal rate and regular rhythm.  ?   Pulses: Normal pulses.  ?   Heart sounds: Normal heart sounds.  ?Pulmonary:  ?   Effort: Pulmonary effort is normal.  ?   Breath sounds: Normal breath sounds. No wheezing, rhonchi or rales.  ?Musculoskeletal:  ?   Cervical back: Normal range of motion and neck supple.  ?   Comments: Right foot (dorsum over proximal aspect of 4th & 5th toes): TTP with mild to moderate soft tissue swelling noted  ?Skin: ?   General: Skin is warm and dry.  ?Neurological:  ?   General: No focal deficit present.  ?   Mental Status: He is alert and oriented to person, place, and time. Mental status is at baseline.  ? ? ? ?UC Treatments / Results  ?Labs ?(all labs ordered are listed, but only abnormal results are displayed) ?Labs Reviewed - No data to display ? ?EKG ? ? ?Radiology ?DG Foot Complete Right ? ?Result Date: 05/02/2021 ?CLINICAL DATA:  Injury to right fifth toe last night EXAM: RIGHT FOOT COMPLETE - 3+ VIEW COMPARISON:  None. FINDINGS: Nondisplaced acute fracture of the proximal aspect of the proximal phalanx in the right fifth toe with surrounding soft tissue swelling, without definite extension to the proximal articular surface. No additional fracture. No dislocation. No suspicious focal osseous lesions. Small Achilles right calcaneal spur. No significant degenerative arthropathy. No radiopaque foreign bodies. IMPRESSION: Nondisplaced acute fracture of the proximal phalanx in the right fifth toe with surrounding soft tissue swelling. Electronically Signed   By: Ilona Sorrel M.D.   On: 05/02/2021 17:57   ? ?Procedures ?Procedures (including critical care time) ? ?Medications Ordered in UC ?Medications - No data  to display ? ?Initial Impression / Assessment and Plan / UC Course  ?I have reviewed the triage vital signs and the nursing notes. ? ?Pertinent labs & imaging results that were available during my care  of the patient were reviewed by me and considered in my medical decision making (see chart for details). ? ?  ? ?MDM: 1.  Nondisplaced fracture of proximal phalanx of right lesser toes, initial encounter-Advised/informed patient of right foot x-ray results this evening-nondisplaced acute fracture of the proximal phalanx in the right fifth toe with surrounding soft tissue swelling.  Hard copy of this study provided to patient with AVS.  Advised patient to wear right postop shoe 24/7 (except when sleeping and bathing) until evaluation by orthopedic provider.  [facility] provider contact information provided with this AVS.  Advised patient to call this provider first thing tomorrow morning, Wednesday, 05/03/2021 to schedule appointment for further evaluation.  Advised patient may take OTC Ibuprofen 800 mg 1-2 times daily, as needed. Advised patient if symptoms worsen and/or unresolved please follow-up with PCP, orthopedics or here for further evaluation.  Right postop shoe placed on patient prior to discharge patient discharged home, hemodynamically stable. ?Final Clinical Impressions(s) / UC Diagnoses  ? ?Final diagnoses:  ?Nondisplaced fracture of proximal phalanx of right lesser toe(s), initial encounter for open fracture  ? ? ? ?Discharge Instructions   ? ?  ?Advised/informed patient of right foot x-ray results this evening-nondisplaced acute fracture of the proximal phalanx in the right fifth toe with surrounding soft tissue swelling.  Hard copy of this study provided to patient with AVS.  Advised patient to wear right postop shoe 24/7 (except when sleeping and bathing) until evaluation by orthopedic provider.  [facility] orthopedic provider contact information provided with this AVS.  Advised patient to  call this provider first thing tomorrow morning, Wednesday, 05/03/2021 to schedule appointment for further evaluation.  Advised patient may take OTC Ibuprofen 800 mg 1-2 times daily, as needed. Advised pati

## 2022-07-12 IMAGING — DX DG CHEST 2V
2 series · 2 of 2 positions shown · non-contrast
Comparison: 11/24/2019.

CLINICAL DATA: Here today for SOB w/ nasal congestionHospitalized
last year w/ COVID pneumonia x 3 weeks in August 2018Concerned about
pneumoniaCongestion x 3 days Denies fever or chills

EXAM:
CHEST - 2 VIEW

[chest pa]
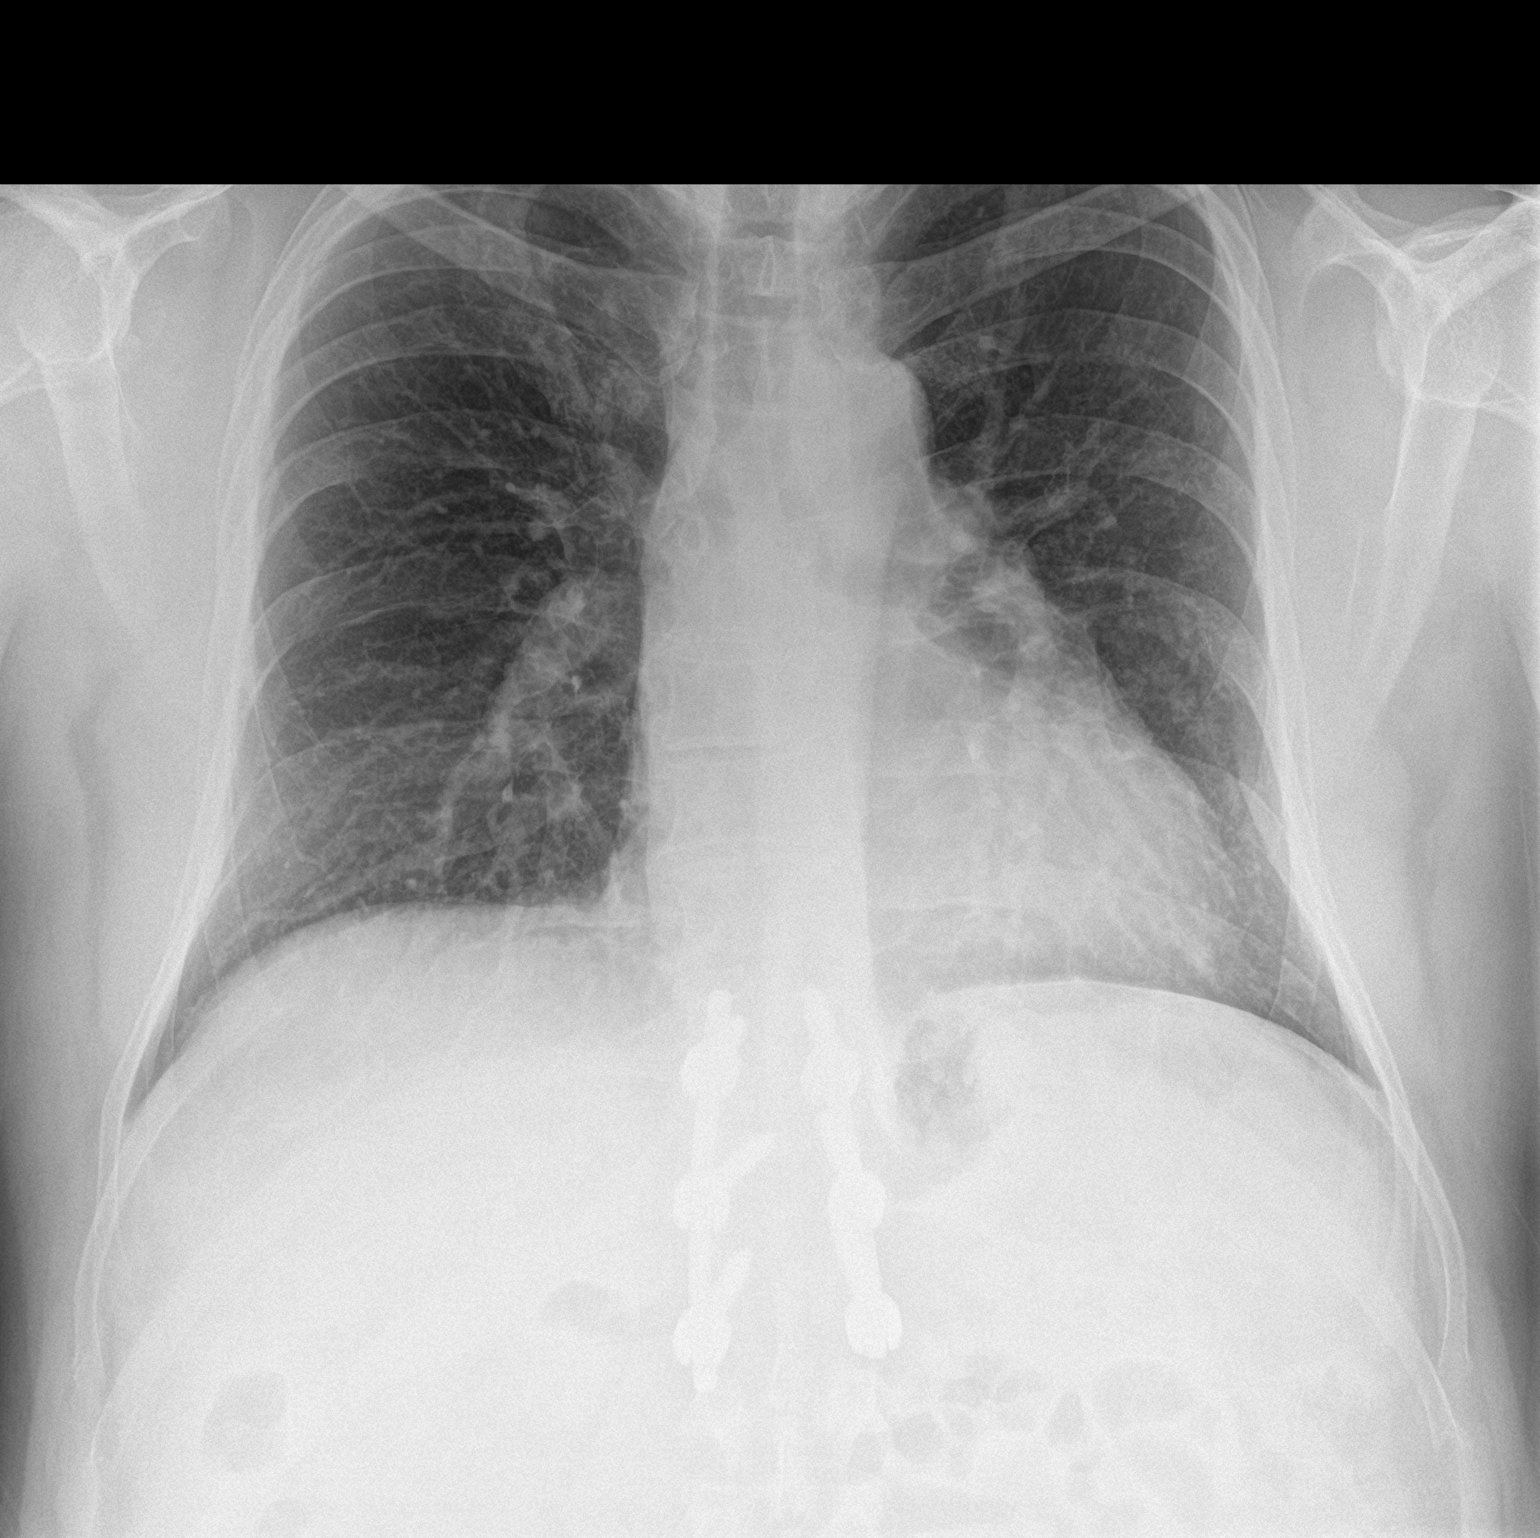

[chest lat]
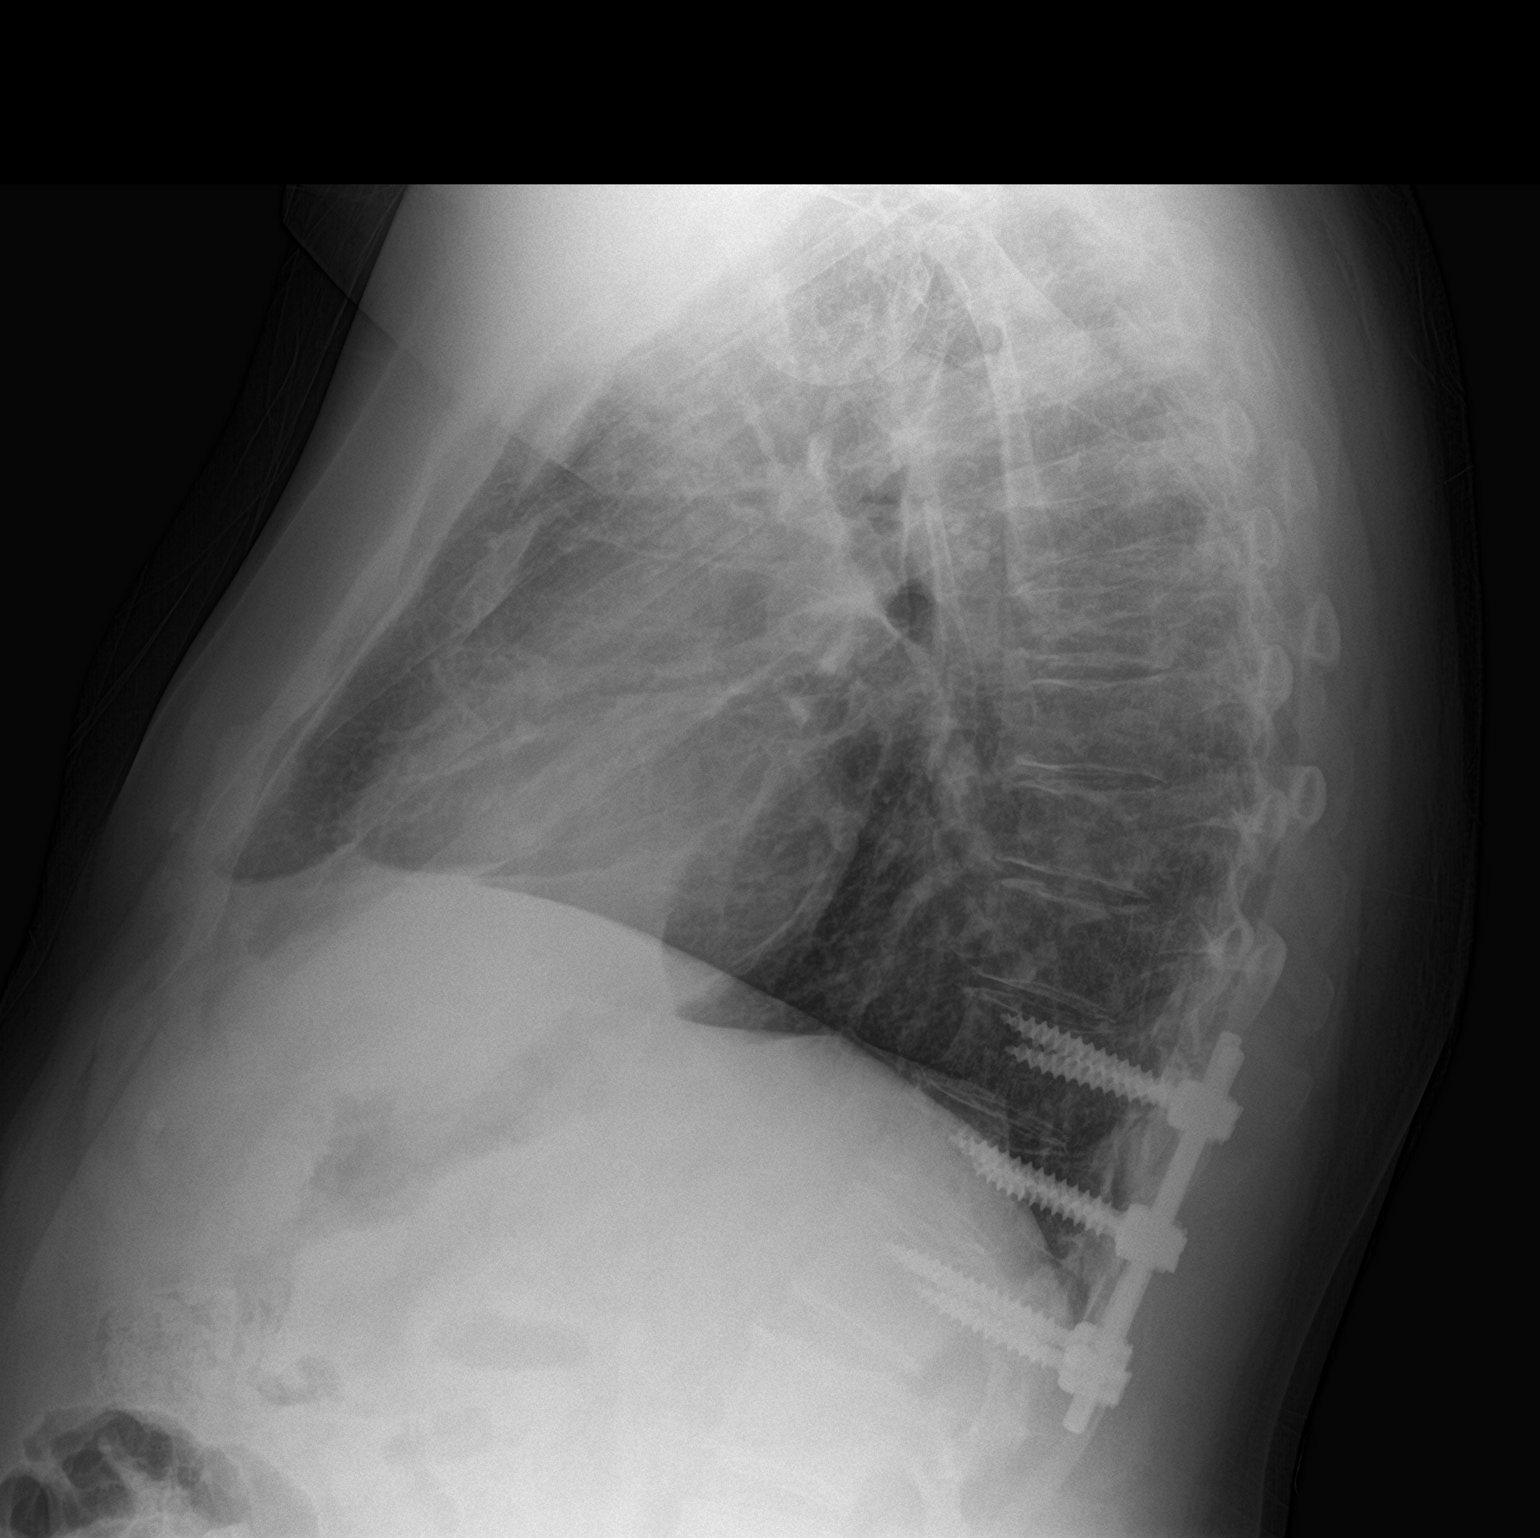

[2 of 2 positions shown; findings below may reference images not displayed]

FINDINGS: Cardiac silhouette is normal in size and configuration. Normal
mediastinal and hilar contours.

Clear lungs.  No pleural effusion or pneumothorax.

Stable changes from a prior lower thoracic spine/upper lumbar spine
fusion. No acute skeletal abnormality.
IMPRESSION: No active cardiopulmonary disease.

## 2023-05-03 IMAGING — DX DG FOOT COMPLETE 3+V*R*
3 series · 3 of 3 positions shown · non-contrast
Comparison: None.

CLINICAL DATA: Injury to right fifth toe last night

EXAM:
RIGHT FOOT COMPLETE - 3+ VIEW

[foot ap]
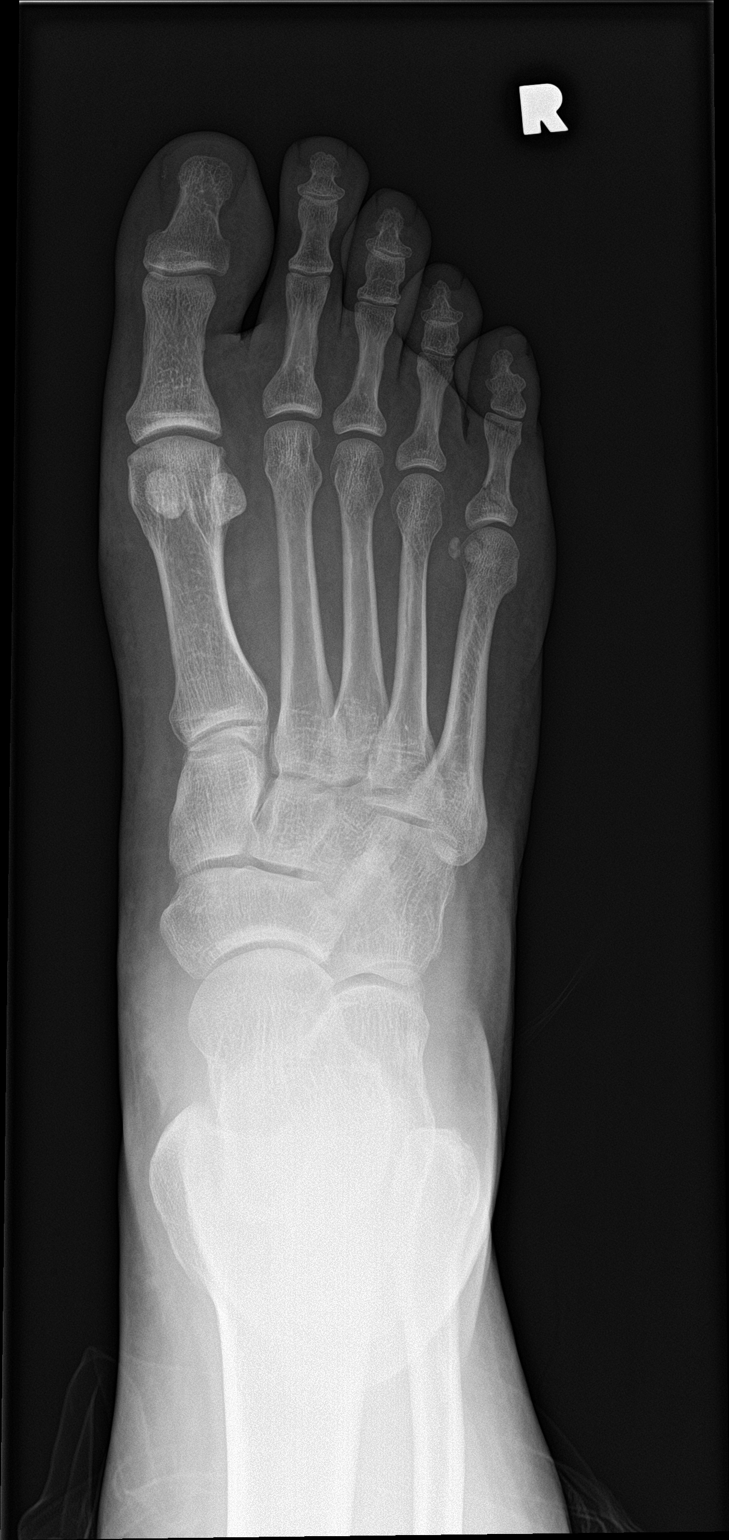

[foot obl]
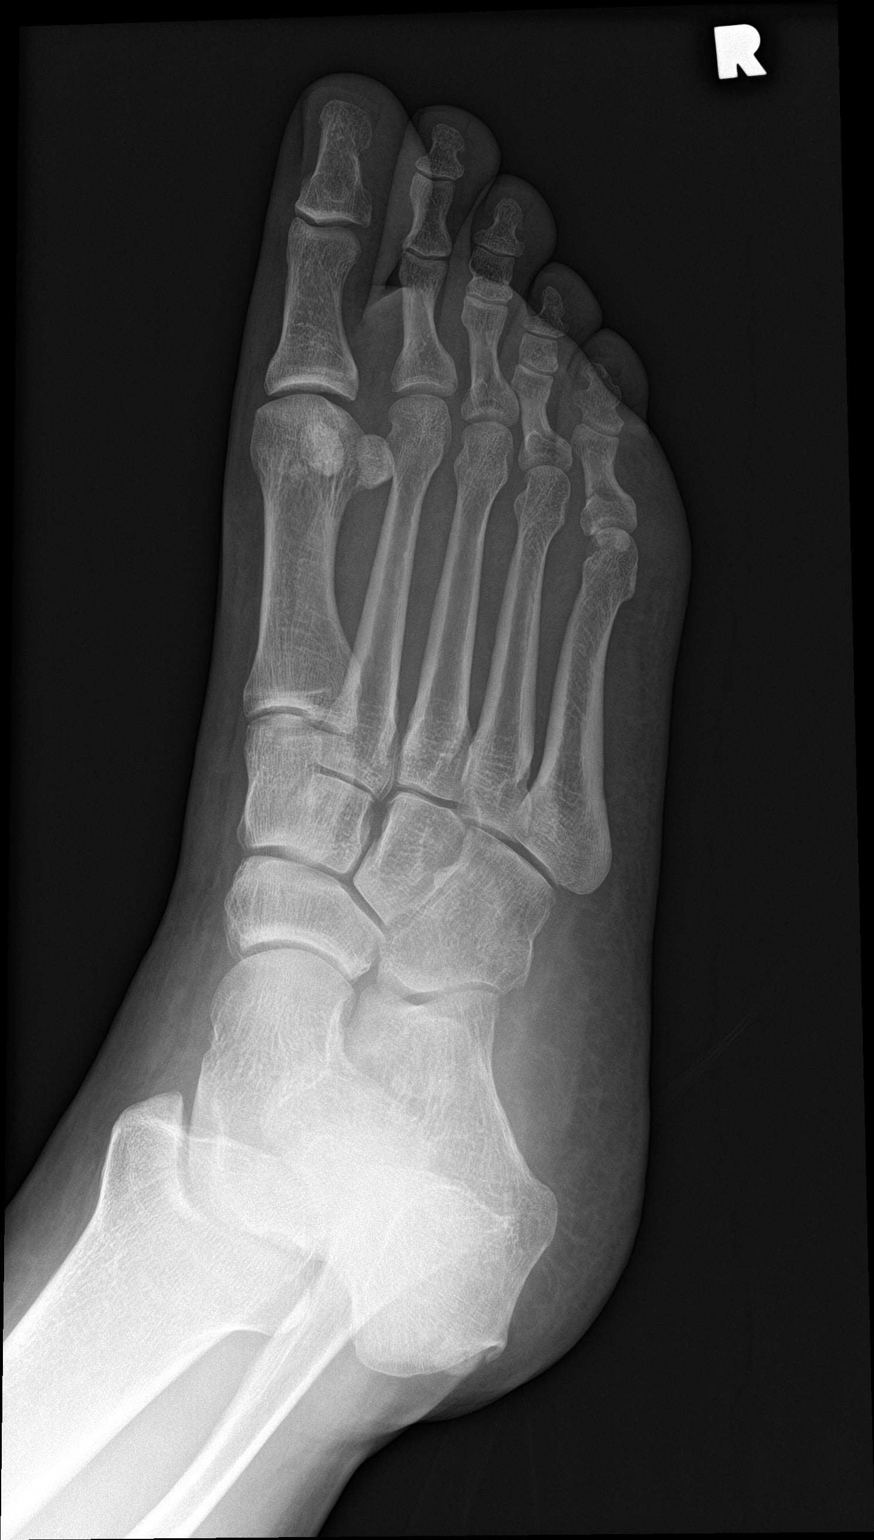

[foot lat]
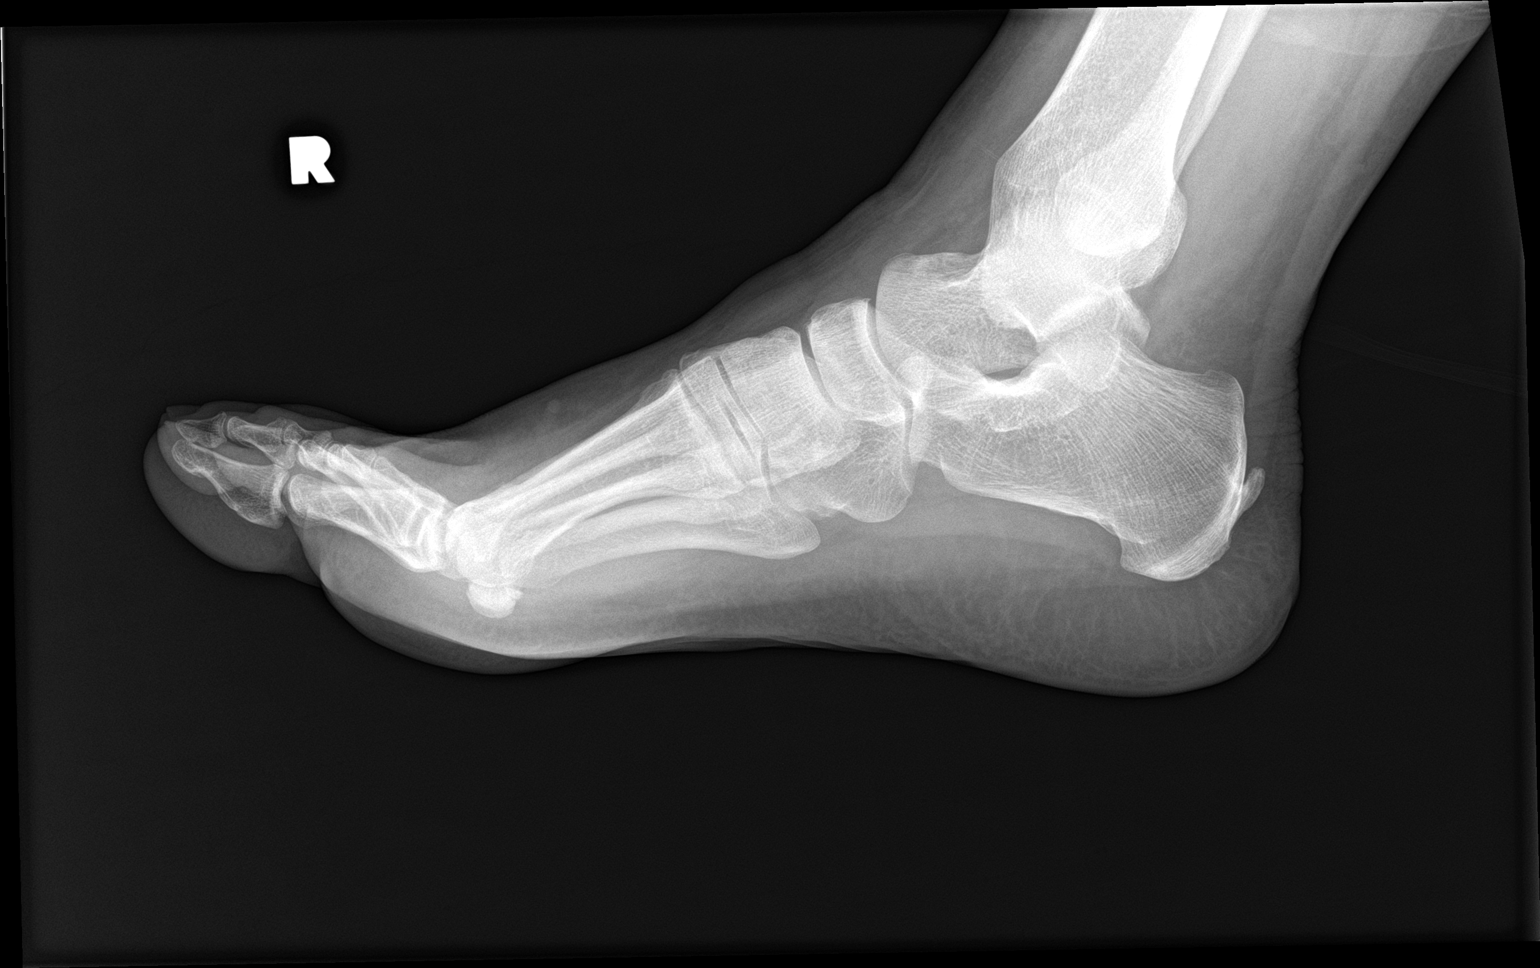

[3 of 3 positions shown; findings below may reference images not displayed]

FINDINGS: Nondisplaced acute fracture of the proximal aspect of the proximal
phalanx in the right fifth toe with surrounding soft tissue
swelling, without definite extension to the proximal articular
surface. No additional fracture. No dislocation. No suspicious focal
osseous lesions. Small Achilles right calcaneal spur. No significant
degenerative arthropathy. No radiopaque foreign bodies.
IMPRESSION: Nondisplaced acute fracture of the proximal phalanx in the right
fifth toe with surrounding soft tissue swelling.
